# Patient Record
Sex: Female | Born: 1975 | Race: White | Hispanic: No | Marital: Married
Health system: Midwestern US, Academic
[De-identification: ages and names within clinical notes are randomized; demographics above are authoritative.]

---

## 2017-12-17 ENCOUNTER — Encounter: Admit: 2017-12-17 | Discharge: 2017-12-17 | Payer: BC Managed Care – PPO

## 2017-12-17 ENCOUNTER — Ambulatory Visit: Admit: 2017-12-17 | Discharge: 2017-12-17 | Payer: BC Managed Care – PPO

## 2017-12-17 ENCOUNTER — Encounter: Admit: 2017-12-17 | Discharge: 2017-12-17

## 2017-12-17 DIAGNOSIS — N393 Stress incontinence (female) (male): ICD-10-CM

## 2017-12-17 DIAGNOSIS — K921 Melena: ICD-10-CM

## 2017-12-17 DIAGNOSIS — Z6838 Body mass index (BMI) 38.0-38.9, adult: ICD-10-CM

## 2017-12-17 DIAGNOSIS — Z0000 Encounter for general adult medical examination without abnormal findings: ICD-10-CM

## 2017-12-17 DIAGNOSIS — N946 Dysmenorrhea, unspecified: ICD-10-CM

## 2017-12-17 DIAGNOSIS — R200 Anesthesia of skin: ICD-10-CM

## 2017-12-17 DIAGNOSIS — I10 Essential (primary) hypertension: ICD-10-CM

## 2017-12-17 DIAGNOSIS — N939 Abnormal uterine and vaginal bleeding, unspecified: Principal | ICD-10-CM

## 2017-12-17 DIAGNOSIS — N814 Uterovaginal prolapse, unspecified: ICD-10-CM

## 2017-12-17 DIAGNOSIS — N644 Mastodynia: ICD-10-CM

## 2017-12-17 DIAGNOSIS — Z889 Allergy status to unspecified drugs, medicaments and biological substances status: Principal | ICD-10-CM

## 2017-12-20 ENCOUNTER — Encounter: Admit: 2017-12-20 | Discharge: 2017-12-20

## 2017-12-21 ENCOUNTER — Encounter: Admit: 2017-12-21 | Discharge: 2017-12-21

## 2017-12-21 DIAGNOSIS — Z889 Allergy status to unspecified drugs, medicaments and biological substances status: Principal | ICD-10-CM

## 2017-12-21 DIAGNOSIS — I10 Essential (primary) hypertension: ICD-10-CM

## 2017-12-21 DIAGNOSIS — N814 Uterovaginal prolapse, unspecified: ICD-10-CM

## 2017-12-21 DIAGNOSIS — R200 Anesthesia of skin: ICD-10-CM

## 2017-12-21 DIAGNOSIS — N393 Stress incontinence (female) (male): ICD-10-CM

## 2017-12-26 ENCOUNTER — Encounter: Admit: 2017-12-26 | Discharge: 2017-12-26

## 2017-12-26 ENCOUNTER — Encounter: Admit: 2017-12-26 | Discharge: 2017-12-26 | Payer: BC Managed Care – PPO

## 2017-12-26 ENCOUNTER — Encounter: Admit: 2017-12-26 | Discharge: 2017-12-27

## 2017-12-26 DIAGNOSIS — N939 Abnormal uterine and vaginal bleeding, unspecified: ICD-10-CM

## 2017-12-26 DIAGNOSIS — N946 Dysmenorrhea, unspecified: ICD-10-CM

## 2017-12-26 DIAGNOSIS — N644 Mastodynia: Principal | ICD-10-CM

## 2017-12-26 DIAGNOSIS — N393 Stress incontinence (female) (male): ICD-10-CM

## 2017-12-26 DIAGNOSIS — Z889 Allergy status to unspecified drugs, medicaments and biological substances status: Principal | ICD-10-CM

## 2017-12-26 DIAGNOSIS — N814 Uterovaginal prolapse, unspecified: ICD-10-CM

## 2017-12-26 DIAGNOSIS — I10 Essential (primary) hypertension: ICD-10-CM

## 2017-12-26 DIAGNOSIS — R200 Anesthesia of skin: ICD-10-CM

## 2018-01-10 ENCOUNTER — Encounter: Admit: 2018-01-10 | Discharge: 2018-01-10

## 2018-01-23 ENCOUNTER — Encounter: Admit: 2018-01-23 | Discharge: 2018-01-23

## 2018-01-23 ENCOUNTER — Ambulatory Visit: Admit: 2018-01-23 | Discharge: 2018-01-23 | Payer: BC Managed Care – PPO

## 2018-01-23 DIAGNOSIS — N393 Stress incontinence (female) (male): ICD-10-CM

## 2018-01-23 DIAGNOSIS — N814 Uterovaginal prolapse, unspecified: ICD-10-CM

## 2018-01-23 DIAGNOSIS — I10 Essential (primary) hypertension: ICD-10-CM

## 2018-01-23 DIAGNOSIS — Z889 Allergy status to unspecified drugs, medicaments and biological substances status: Principal | ICD-10-CM

## 2018-01-23 DIAGNOSIS — N939 Abnormal uterine and vaginal bleeding, unspecified: Principal | ICD-10-CM

## 2018-01-23 DIAGNOSIS — Z30430 Encounter for insertion of intrauterine contraceptive device: ICD-10-CM

## 2018-01-23 DIAGNOSIS — N946 Dysmenorrhea, unspecified: ICD-10-CM

## 2018-01-23 DIAGNOSIS — R200 Anesthesia of skin: ICD-10-CM

## 2018-01-23 MED ORDER — LEVONORGESTREL 20 MCG/24 HOURS (5 YRS) 52 MG IU IUD
1 | Freq: Once | INTRAUTERINE | 0 refills | Status: CP
Start: 2018-01-23 — End: ?

## 2018-01-25 ENCOUNTER — Encounter: Admit: 2018-01-25 | Discharge: 2018-01-25

## 2018-03-06 ENCOUNTER — Encounter: Admit: 2018-03-06 | Discharge: 2018-03-06

## 2018-03-06 ENCOUNTER — Ambulatory Visit: Admit: 2018-03-06 | Discharge: 2018-03-07 | Payer: BC Managed Care – PPO

## 2018-03-06 ENCOUNTER — Ambulatory Visit: Admit: 2018-03-06 | Discharge: 2018-03-06 | Payer: BC Managed Care – PPO

## 2018-03-06 DIAGNOSIS — Z30431 Encounter for routine checking of intrauterine contraceptive device: Principal | ICD-10-CM

## 2018-03-06 DIAGNOSIS — N800 Endometriosis of uterus: ICD-10-CM

## 2018-03-06 DIAGNOSIS — R1084 Generalized abdominal pain: ICD-10-CM

## 2018-03-06 DIAGNOSIS — Z889 Allergy status to unspecified drugs, medicaments and biological substances status: Principal | ICD-10-CM

## 2018-03-06 DIAGNOSIS — N814 Uterovaginal prolapse, unspecified: ICD-10-CM

## 2018-03-06 DIAGNOSIS — R200 Anesthesia of skin: ICD-10-CM

## 2018-03-06 DIAGNOSIS — N393 Stress incontinence (female) (male): ICD-10-CM

## 2018-03-06 DIAGNOSIS — I10 Essential (primary) hypertension: ICD-10-CM

## 2018-04-17 ENCOUNTER — Encounter: Admit: 2018-04-17 | Discharge: 2018-04-17

## 2018-04-23 ENCOUNTER — Encounter: Admit: 2018-04-23 | Discharge: 2018-04-23

## 2018-04-26 ENCOUNTER — Encounter: Admit: 2018-04-26 | Discharge: 2018-04-26

## 2018-04-26 ENCOUNTER — Ambulatory Visit: Admit: 2018-04-26 | Discharge: 2018-04-27 | Payer: BC Managed Care – PPO

## 2018-04-26 DIAGNOSIS — N939 Abnormal uterine and vaginal bleeding, unspecified: Principal | ICD-10-CM

## 2018-04-26 DIAGNOSIS — R200 Anesthesia of skin: ICD-10-CM

## 2018-04-26 DIAGNOSIS — N393 Stress incontinence (female) (male): ICD-10-CM

## 2018-04-26 DIAGNOSIS — Z889 Allergy status to unspecified drugs, medicaments and biological substances status: Principal | ICD-10-CM

## 2018-04-26 DIAGNOSIS — N814 Uterovaginal prolapse, unspecified: ICD-10-CM

## 2018-04-26 DIAGNOSIS — I10 Essential (primary) hypertension: ICD-10-CM

## 2018-04-26 DIAGNOSIS — N800 Endometriosis of uterus: ICD-10-CM

## 2018-04-30 ENCOUNTER — Encounter: Admit: 2018-04-30 | Discharge: 2018-04-30

## 2018-05-01 DIAGNOSIS — N939 Abnormal uterine and vaginal bleeding, unspecified: Principal | ICD-10-CM

## 2018-05-02 ENCOUNTER — Encounter: Admit: 2018-05-02 | Discharge: 2018-05-02

## 2018-05-02 ENCOUNTER — Ambulatory Visit: Admit: 2018-05-02 | Discharge: 2018-05-02

## 2018-05-02 ENCOUNTER — Ambulatory Visit: Admit: 2018-05-02 | Discharge: 2018-05-02 | Payer: BC Managed Care – PPO

## 2018-05-02 ENCOUNTER — Ambulatory Visit: Admit: 2018-05-01 | Discharge: 2018-05-01

## 2018-05-02 DIAGNOSIS — Z889 Allergy status to unspecified drugs, medicaments and biological substances status: Principal | ICD-10-CM

## 2018-05-02 DIAGNOSIS — Z7982 Long term (current) use of aspirin: ICD-10-CM

## 2018-05-02 DIAGNOSIS — I10 Essential (primary) hypertension: ICD-10-CM

## 2018-05-02 DIAGNOSIS — N814 Uterovaginal prolapse, unspecified: ICD-10-CM

## 2018-05-02 DIAGNOSIS — R200 Anesthesia of skin: ICD-10-CM

## 2018-05-02 DIAGNOSIS — N393 Stress incontinence (female) (male): ICD-10-CM

## 2018-05-02 DIAGNOSIS — N939 Abnormal uterine and vaginal bleeding, unspecified: Principal | ICD-10-CM

## 2018-05-02 DIAGNOSIS — N946 Dysmenorrhea, unspecified: ICD-10-CM

## 2018-05-02 LAB — CBC
Lab: 11 g/dL — ABNORMAL LOW (ref 12.0–15.0)
Lab: 15 % — ABNORMAL HIGH (ref 11–15)
Lab: 23 pg — ABNORMAL LOW (ref 26–34)
Lab: 32 g/dL (ref 32.0–36.0)
Lab: 324 10*3/uL (ref 150–400)
Lab: 34 % — ABNORMAL LOW (ref 36–45)
Lab: 4.8 M/UL (ref 4.0–5.0)
Lab: 7.4 FL (ref 7–11)
Lab: 72 FL — ABNORMAL LOW (ref 80–100)
Lab: 8.3 10*3/uL (ref 4.5–11.0)

## 2018-05-02 LAB — PREGNANCY TEST-URINE
Lab: 1
Lab: NEGATIVE

## 2018-05-02 MED ORDER — LIDOCAINE (PF) 200 MG/10 ML (2 %) IJ SYRG
0 refills | Status: DC
Start: 2018-05-02 — End: 2018-05-02
  Administered 2018-05-02: 20:00:00 100 mg via INTRAVENOUS

## 2018-05-02 MED ORDER — SUGAMMADEX 100 MG/ML IV SOLN
INTRAVENOUS | 0 refills | Status: DC
Start: 2018-05-02 — End: 2018-05-02
  Administered 2018-05-02: 21:00:00 200 mg via INTRAVENOUS

## 2018-05-02 MED ORDER — LACTATED RINGERS IV SOLP
1000 mL | INTRAVENOUS | 0 refills | Status: DC
Start: 2018-05-02 — End: 2018-05-03
  Administered 2018-05-02: 21:00:00 1000.000 mL via INTRAVENOUS
  Administered 2018-05-02: 19:00:00 1000 mL via INTRAVENOUS

## 2018-05-02 MED ORDER — DEXAMETHASONE SODIUM PHOSPHATE 4 MG/ML IJ SOLN
INTRAVENOUS | 0 refills | Status: DC
Start: 2018-05-02 — End: 2018-05-02
  Administered 2018-05-02: 20:00:00 4 mg via INTRAVENOUS

## 2018-05-02 MED ORDER — OXYCODONE 5 MG PO TAB
5 mg | ORAL_TABLET | ORAL | 0 refills | 6.00000 days | Status: AC | PRN
Start: 2018-05-02 — End: ?
  Filled 2018-05-02 (×2): qty 20, 4d supply, fill #1

## 2018-05-02 MED ORDER — KETOROLAC 30 MG/ML (1 ML) IJ SOLN
0 refills | Status: DC
Start: 2018-05-02 — End: 2018-05-02
  Administered 2018-05-02: 21:00:00 30 mg via INTRAVENOUS

## 2018-05-02 MED ORDER — PROPOFOL INJ 10 MG/ML IV VIAL
0 refills | Status: DC
Start: 2018-05-02 — End: 2018-05-02
  Administered 2018-05-02: 20:00:00 200 mg via INTRAVENOUS

## 2018-05-02 MED ORDER — FENTANYL CITRATE (PF) 50 MCG/ML IJ SOLN
25 ug | INTRAVENOUS | 0 refills | Status: DC | PRN
Start: 2018-05-02 — End: 2018-05-03

## 2018-05-02 MED ORDER — LIDOCAINE (PF) 10 MG/ML (1 %) IJ SOLN
.1-2 mL | INTRAMUSCULAR | 0 refills | Status: DC | PRN
Start: 2018-05-02 — End: 2018-05-03

## 2018-05-02 MED ORDER — GABAPENTIN 300 MG PO CAP
300 mg | Freq: Once | ORAL | 0 refills | Status: CP
Start: 2018-05-02 — End: ?
  Administered 2018-05-02: 19:00:00 300 mg via ORAL

## 2018-05-02 MED ORDER — PHENYLEPHRINE IN 0.9% NACL(PF) 1 MG/10 ML (100 MCG/ML) IV SYRG
INTRAVENOUS | 0 refills | Status: DC
Start: 2018-05-02 — End: 2018-05-02
  Administered 2018-05-02 (×3): 100 ug via INTRAVENOUS

## 2018-05-02 MED ORDER — ONDANSETRON 4 MG PO TBDI
4 mg | ORAL_TABLET | ORAL | 0 refills | 8.00000 days | Status: AC | PRN
Start: 2018-05-02 — End: ?

## 2018-05-02 MED ORDER — ROCURONIUM 10 MG/ML IV SOLN
INTRAVENOUS | 0 refills | Status: DC
Start: 2018-05-02 — End: 2018-05-02
  Administered 2018-05-02: 21:00:00 10 mg via INTRAVENOUS
  Administered 2018-05-02: 20:00:00 30 mg via INTRAVENOUS
  Administered 2018-05-02: 20:00:00 10 mg via INTRAVENOUS

## 2018-05-02 MED ORDER — DEXTRAN 70-HYPROMELLOSE (PF) 0.1-0.3 % OP DPET
0 refills | Status: DC
Start: 2018-05-02 — End: 2018-05-02
  Administered 2018-05-02: 20:00:00 2 [drp] via OPHTHALMIC

## 2018-05-02 MED ORDER — CEFAZOLIN 1 GRAM IJ SOLR
0 refills | Status: DC
Start: 2018-05-02 — End: 2018-05-02
  Administered 2018-05-02: 20:00:00 3 g via INTRAVENOUS

## 2018-05-02 MED ORDER — SUCCINYLCHOLINE CHLORIDE 20 MG/ML IJ SOLN
INTRAVENOUS | 0 refills | Status: DC
Start: 2018-05-02 — End: 2018-05-02
  Administered 2018-05-02: 20:00:00 120 mg via INTRAVENOUS

## 2018-05-02 MED ORDER — ONDANSETRON HCL (PF) 4 MG/2 ML IJ SOLN
INTRAVENOUS | 0 refills | Status: DC
Start: 2018-05-02 — End: 2018-05-02
  Administered 2018-05-02: 21:00:00 4 mg via INTRAVENOUS

## 2018-05-02 MED ORDER — ONDANSETRON HCL (PF) 4 MG/2 ML IJ SOLN
4 mg | Freq: Once | INTRAVENOUS | 0 refills | Status: DC | PRN
Start: 2018-05-02 — End: 2018-05-03

## 2018-05-02 MED ORDER — FENTANYL CITRATE (PF) 50 MCG/ML IJ SOLN
0 refills | Status: DC
Start: 2018-05-02 — End: 2018-05-02
  Administered 2018-05-02: 20:00:00 100 ug via INTRAVENOUS

## 2018-05-02 MED ORDER — LIDOCAINE-EPINEPHRINE 1 %-1:100,000 IJ SOLN
0 refills | Status: DC
Start: 2018-05-02 — End: 2018-05-02
  Administered 2018-05-02: 20:00:00 10 mL via INTRAMUSCULAR

## 2018-05-02 MED ORDER — OXYCODONE 5 MG PO TAB
5-10 mg | Freq: Once | ORAL | 0 refills | Status: CP | PRN
Start: 2018-05-02 — End: ?
  Administered 2018-05-02: 22:00:00 10 mg via ORAL

## 2018-05-02 MED ORDER — MIDAZOLAM 1 MG/ML IJ SOLN
INTRAVENOUS | 0 refills | Status: DC
Start: 2018-05-02 — End: 2018-05-02
  Administered 2018-05-02: 20:00:00 2 mg via INTRAVENOUS

## 2018-05-02 MED ORDER — DIPHENHYDRAMINE HCL 50 MG/ML IJ SOLN
25 mg | Freq: Once | INTRAVENOUS | 0 refills | Status: DC | PRN
Start: 2018-05-02 — End: 2018-05-03

## 2018-05-02 MED ORDER — ACETAMINOPHEN 500 MG PO TAB
1000 mg | Freq: Once | ORAL | 0 refills | Status: CP
Start: 2018-05-02 — End: ?
  Administered 2018-05-02: 19:00:00 1000 mg via ORAL

## 2018-05-04 ENCOUNTER — Encounter: Admit: 2018-05-04 | Discharge: 2018-05-04

## 2018-05-04 DIAGNOSIS — N814 Uterovaginal prolapse, unspecified: ICD-10-CM

## 2018-05-04 DIAGNOSIS — N393 Stress incontinence (female) (male): ICD-10-CM

## 2018-05-04 DIAGNOSIS — Z889 Allergy status to unspecified drugs, medicaments and biological substances status: Principal | ICD-10-CM

## 2018-05-04 DIAGNOSIS — R200 Anesthesia of skin: ICD-10-CM

## 2018-05-04 DIAGNOSIS — I10 Essential (primary) hypertension: ICD-10-CM

## 2018-05-08 ENCOUNTER — Encounter: Admit: 2018-05-08 | Discharge: 2018-05-08

## 2019-02-14 ENCOUNTER — Encounter: Admit: 2019-02-14 | Discharge: 2019-02-14

## 2019-02-14 DIAGNOSIS — Z1231 Encounter for screening mammogram for malignant neoplasm of breast: Principal | ICD-10-CM

## 2019-05-16 ENCOUNTER — Ambulatory Visit: Admit: 2019-05-16 | Discharge: 2019-05-16

## 2019-05-16 ENCOUNTER — Encounter: Admit: 2019-05-16 | Discharge: 2019-05-16

## 2019-05-16 DIAGNOSIS — I10 Essential (primary) hypertension: Secondary | ICD-10-CM

## 2019-05-16 DIAGNOSIS — Z1231 Encounter for screening mammogram for malignant neoplasm of breast: Secondary | ICD-10-CM

## 2019-05-16 DIAGNOSIS — Z889 Allergy status to unspecified drugs, medicaments and biological substances status: Secondary | ICD-10-CM

## 2019-05-16 DIAGNOSIS — R200 Anesthesia of skin: Secondary | ICD-10-CM

## 2019-05-16 DIAGNOSIS — N393 Stress incontinence (female) (male): Secondary | ICD-10-CM

## 2019-05-16 DIAGNOSIS — N814 Uterovaginal prolapse, unspecified: Secondary | ICD-10-CM

## 2019-05-16 NOTE — Progress Notes
Name: Tiffany Abbott          MRN: [id number]      DOB: 1976/06/08      AGE: 43 y.o.   DATE OF SERVICE: 05/16/2019    Subjective:             Reason for Visit:  Wellness      SHALISSA Abbott is a 43 y.o. female.     DIAGNOSIS:    1.  Need for breast cancer screening  2.  History of left breast pain     History of Present Illness    Ms. Tiffany Abbott is a Caucasian female who presented to the [facility] on 12/26/2017 at age 23 for evaluation of left breast pain.  She reported this began approximately 6-8 months prior, and was worse during her menstrual cycle.  She described the pain as being located in the UOQ and into the left axilla.  She reported the pain was fairly constant, but varied in intensity.  It was mostly achy in nature.  She additionally reported a 6 week history of left breast itching. She had tried some topical lotions, but this had not improved her itching. She reported her bra fits her well, but it was not very supportive as she has stopped wearing underwires.  She denied any trauma. She had no prior history of breast biopsies or surgeries.  She had no other concerns such as palpable masses, skin changes, or nipple discharge.  Imaging at [place] was benign, and she was advised to have routine annual mammograms.  She presents today for that purpose.    BREAST IMAGING:  Mammogram:    -- Bilateral screening mammogram 08/03/16 [facility]) revealed no suspicious masses, microcalcifications, or suspicious architectural distortion.  Bilateral axillary lymph nodes appeared normal.  -- Bilateral diagnostic mammogram 12/26/17 ([place]) revealed no suspicious mass, architectural distortion or microcalcifications to suggest malignancy. ???No significant changes from prior exam.    REPRODUCTIVE HEALTH:  Age at first Menarche: 81   Age at First Live Birth:  78  Age at Menopause:  Pre-menopausal  Gravida:  3  Para: 3  Breastfeeding:  6-8 months with each child    PROCEDURE: None  PERTINENT PMH:  HTN FAMILY HISTORY: Patient denies a family history of breast, ovarian or prostate cancer.    PHYSICAL EXAM on PRESENTATION:  No palpable breast masses. No skin, nipple, or areolar change. No supraclavicular, infraclavicular, or axillary adenopathy.      REFERRED BY: Leone Brand, MD         Review of Systems    Constitutional: Negative for fever, chills, appetite change and fatigue.   HENT: Negative for hearing loss, congestion, rhinorrhea and tinnitus.    Eyes: Negative for pain, discharge and itching.   Respiratory: Negative for cough, chest tightness and shortness of breath.    Cardiovascular: Negative for chest pain and palpitations.   Gastrointestinal: Negative for abdominal distention, pain, nausea, vomiting, and diarrhea.   Genitourinary: Negative for frequency, vaginal bleeding, difficulty urinating and pelvic pain.   Musculoskeletal: Negative for myalgias, back pain, joint swelling and arthralgias.   Skin: Negative for rash.   Neurological: Negative for dizziness, weakness, light-headedness and headaches.   Hematological: Does not bruise/bleed easily.   Psychiatric/Behavioral: Negative for disturbed wake/sleep cycle. The patient is not nervous/anxious.    The following medical/surgical/family/social history and the list of medications are current, as of 05/16/2019    Medical History:   Diagnosis Date   ??? 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 H/O seasonal allergies    ??? Hypertension    ??? SUI (stress urinary incontinence, female) 2016    Resolved with PT   ??? Uterine prolapse 2016    Resolved with PT     Surgical History:   Procedure Laterality Date   ??? VAGINAL HYSTERECTOMY WITH REMOVAL TUBE - UTERUS 250 G LESS Bilateral 05/02/2018    Performed by Tawny Hopping, MD at [facility] OR   ??? CYSTOURETHROSCOPY N/A 05/02/2018    Performed by Tawny Hopping, MD at [facility] OR   ??? HX CHOLECYSTECTOMY     ??? WISDOM TEETH EXTRACTION       Family History   Problem Relation Age of Onset ??? Hypertension Father    ??? Hypertension Mother    ??? Cancer Paternal Grandmother    ??? Cancer-Uterine Paternal Grandmother    ??? Heart Failure Maternal Grandmother    ??? Parkinson's  Maternal Grandfather    ??? Other Son         MARFANS - father side of family     Social History     Socioeconomic History   ??? Marital status: Married     Spouse name: Not on file   ??? Number of children: 3   ??? Years of education: Not on file   ??? Highest education level: Not on file   Occupational History   ??? Occupation: Runner, broadcasting/film/video   Tobacco Use   ??? Smoking status: Never Smoker   ??? Smokeless tobacco: Never Used   Substance and Sexual Activity   ??? Alcohol use: No     Alcohol/week: 0.0 standard drinks   ??? Drug use: No   ??? Sexual activity: Yes     Partners: Male   Other Topics Concern   ??? Not on file   Social History Narrative   ??? Not on file       Allergies   Allergen Reactions   ??? Seasonal Allergies UNKNOWN       Objective:         ??? ALPRAZolam (XANAX) 0.25 mg tablet    ??? aspirin EC 81 mg tablet Take 81 mg by mouth daily. Take with food.   ??? cetirizine HCl (ZYRTEC PO) Take  by mouth.   ??? ondansetron (ZOFRAN ODT) 4 mg rapid dissolve tablet Dissolve one tablet by mouth every 8 hours as needed. Place on tongue to disolve.   ??? oxyCODONE (ROXICODONE, OXY-IR) 5 mg tablet Take one tablet by mouth every 4 hours as needed for Pain     Vitals:    05/16/19 1333   BP: (!) 150/86   BP Source: Arm, Left Upper   Patient Position: Sitting   Pulse: 83   Resp: 20   Temp: 36.8 ???C (98.2 ???F)   TempSrc: Temporal   SpO2: 100%   Weight: 121.1 kg (267 lb)   Height: 177.8 cm (70)   PainSc: Three     Body mass index is 38.31 kg/m???.     Pain Score: Three  Pain Loc: Shoulder         Pain Addressed:  N/A    Patient Evaluated for a Clinical Trial: No treatment clinical trial available for this patient.     [place] Cooperative Oncology Group performance status is 0, Fully active, able to carry on all pre-disease performance without restriction.Marland Kitchen     Physical Exam Vitals signs reviewed.  RIGHT BREAST EXAM:  Breast:  No palpable breast masses. No skin, nipple, or areolar change.   Skin Erythema:  No  Attachment of Overlying Skin:  No  Peau d' orange:  No  Chest Wall Attachment:  No  Nipple Inversion:  No  Nipple Discharge: No    LEFT BREAST EXAM:  Breast: No palpable breast masses. No skin, nipple, or areolar change.   Skin Erythema:  No  Attachment of Overlying Skin:  No  Peau d' orange:  No  Chest Wall Attachment: No  Nipple Inversion:  No  Nipple Discharge:  No    RIGHT NODAL BASIN EXAM:  Axillary:  negative  Infraclavicular:  negative  Supraclavicular:  negative    LEFT NODAL BASIN EXAM:  Axillary:  negative  Infraclavicular: negative  Supraclavicular:  negative    Constitutional: No acute distress.  HEENT:  Head: Normocephalic and atraumatic.  Eyes: No discharge. No scleral icterus.  Pulmonary/Chest: No respiratory distress.   Lymphadenopathy: There is no axillary, infraclavicular, or supraclavicular adenopathy.  Neurological: Alert and oriented to person, place and time. No cranial nerve deficit.  Skin: Warm and dry. No rash noted. No erythema. No pallor.  Psychiatric: Normal mood and affect. Behavior is normal. Judgement and thought content normal.    Imaging Review:    [id number] DIGITAL MAMMO SCREEN BILAT/TOMO/CAD: May 16, 2019 -   ACCESSION #: [id number]  3D Procedure  3D Routine views.  2D Synthetic Routine views.    Prior study comparison: December 26, 2017, bilateral [id number] MAMMO   DIAGNOSTIC BIL/TOMO, performed at [facility]. ???August 03, 2016, mammogram.    There are scattered areas of fibroglandular density. ???3-D and   synthetic 2-D images of the bilateral breasts were obtained.    There are no new suspicious masses, calcifications, or areas of   architectural distortion. There is no significant change when   compared with prior mammograms.    By my electronic signature, I attest that I have personally reviewed the images for this examination and formulated the   interpretations and opinions expressed in this report      Finalized by Nicholos Johns, M.D. on 05/16/2019 1:38 PM. Dictated  by Denny Peon, M.D. on 05/16/2019 1:34 PM.      Electronically signed and approved by: Nicholos Johns, M.D.   [id number]  IMPRESSION    ASSESSMENT: BIRAD 1-Negative        RECOMMENDATION:  Routine screening mammogram of both breasts in 1 year.         Assessment and Plan:    43 y/o female presenting for asymptomatic breast exam and bilateral screening mammogram     Ms. Fraise is doing well.  Her exam and mammogram today are both benign.  She had a vaginal hysterectomy in 04/2018 with Dr. Juanita Craver due to DUB, and has recovered well.  There have been no other changes to her past medical or family history since her last visit.  She will RTC in 1 year for CBE at the time of her next bilateral screening mammogram.  She will call in the interim with any questions or concerns. Ms. Carothers was given ample time to ask questions all of which were answered to her satisfaction.     1.  Bilateral screening mammogram in 1 year  2.  RTC in 1 year    Scarlett Presto, [place]

## 2021-05-13 ENCOUNTER — Encounter: Admit: 2021-05-13 | Discharge: 2021-05-13 | Payer: BC Managed Care – PPO

## 2021-05-13 DIAGNOSIS — Z1231 Encounter for screening mammogram for malignant neoplasm of breast: Secondary | ICD-10-CM

## 2021-05-17 ENCOUNTER — Encounter: Admit: 2021-05-17 | Discharge: 2021-05-17 | Payer: BC Managed Care – PPO

## 2021-05-17 ENCOUNTER — Ambulatory Visit: Admit: 2021-05-17 | Discharge: 2021-05-17 | Payer: BC Managed Care – PPO

## 2021-05-17 DIAGNOSIS — Z1231 Encounter for screening mammogram for malignant neoplasm of breast: Secondary | ICD-10-CM

## 2021-08-25 ENCOUNTER — Encounter: Admit: 2021-08-25 | Discharge: 2021-08-25 | Payer: BC Managed Care – PPO

## 2022-09-29 IMAGING — MG MAMMO SCREEN BILAT/TOMO
5 series · 5 of 5 positions shown · non-contrast
Comparison: none

[R CC synth-2D (1 of 2)]
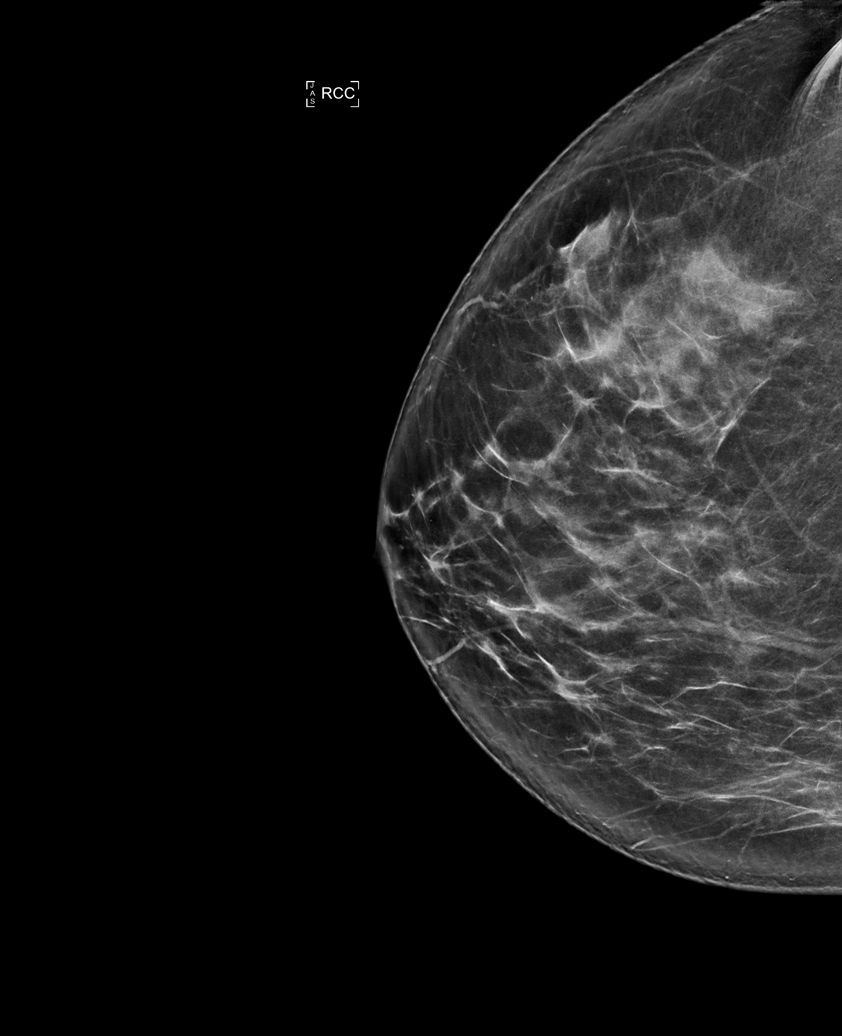

[R CC synth-2D (2 of 2)]
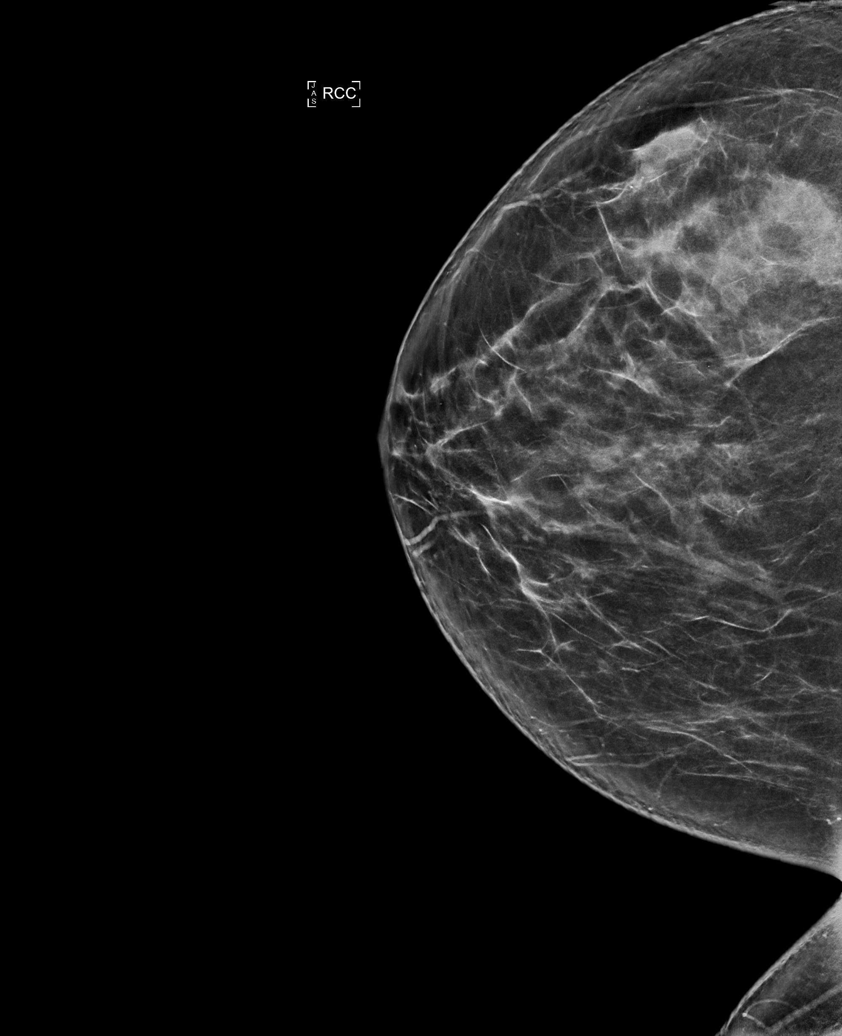

[R MLO synth-2D]
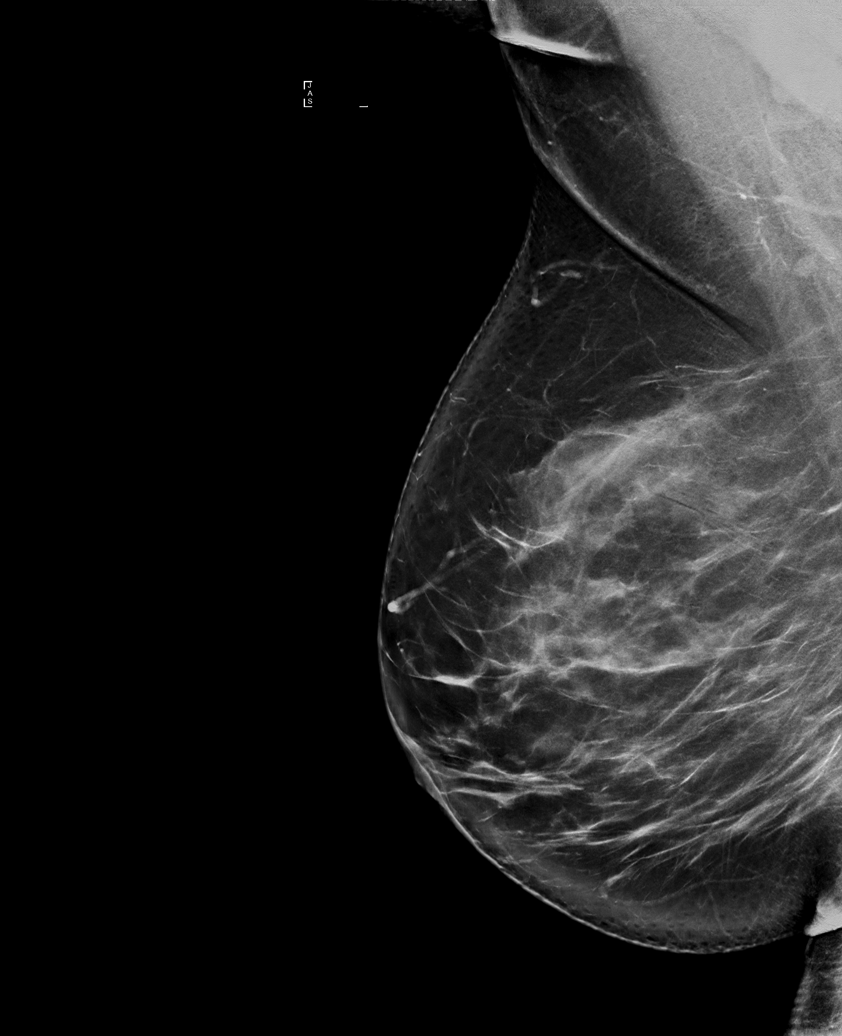

[L CC synth-2D]
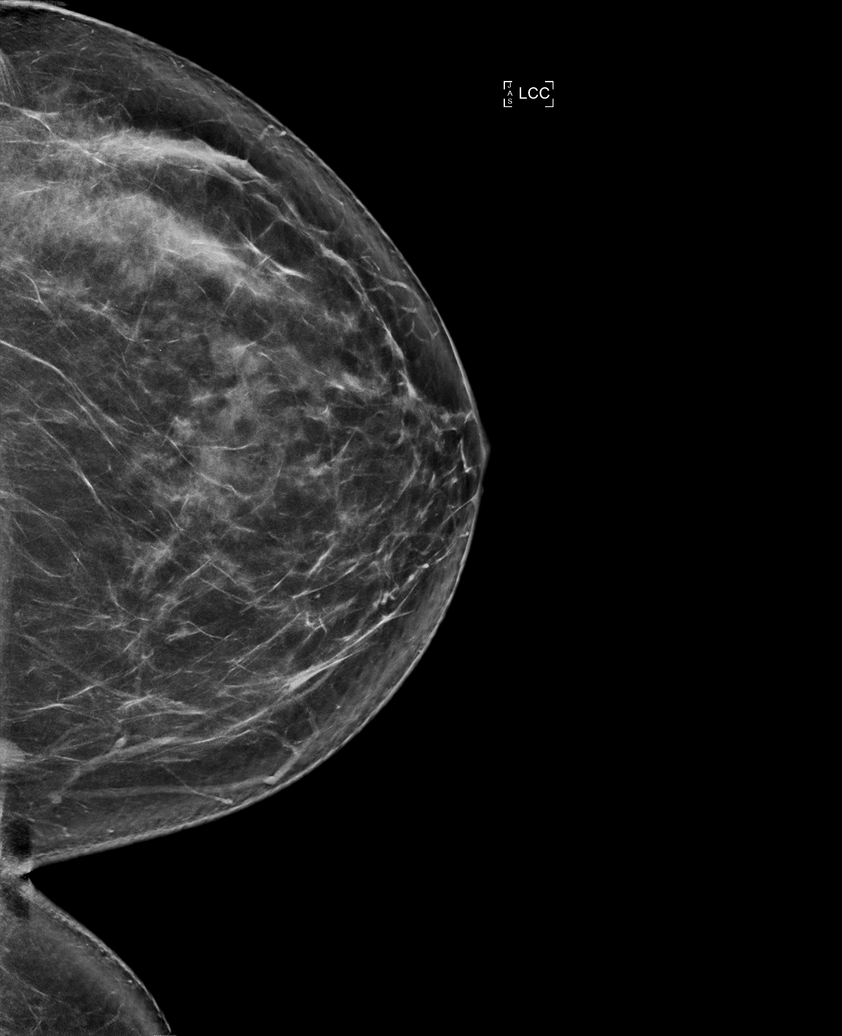

[L MLO synth-2D]
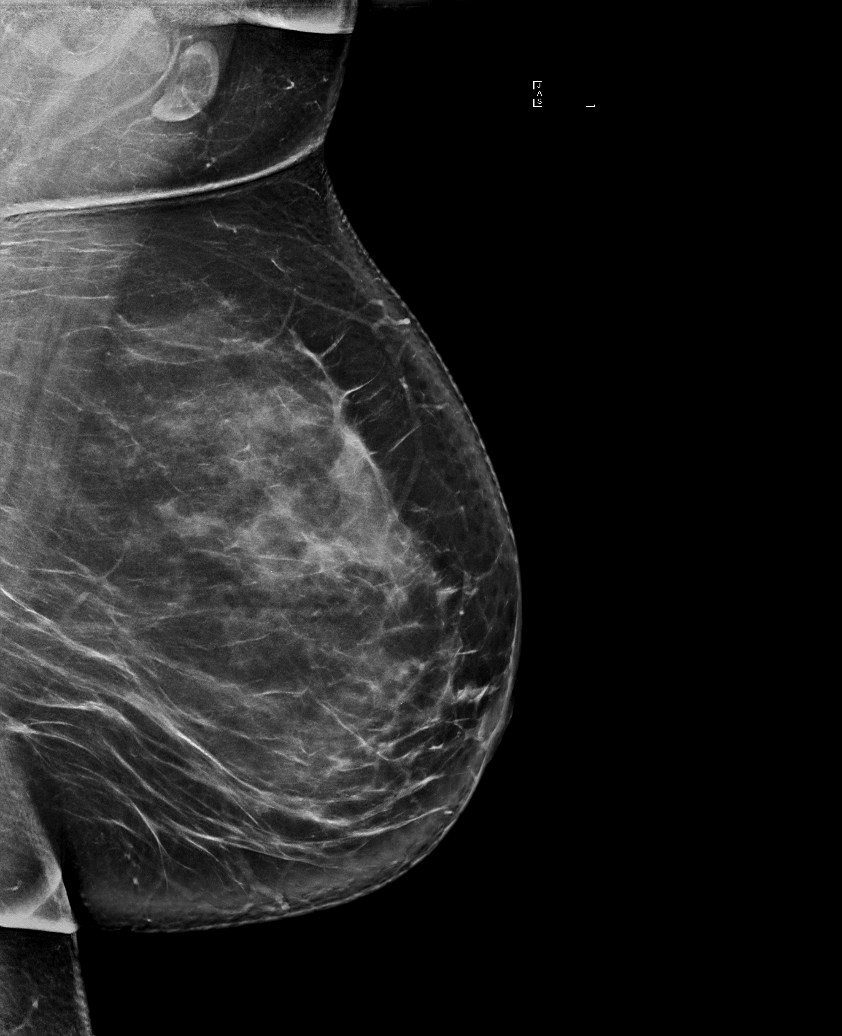

[5 of 5 positions shown; findings below may reference images not displayed]

Last mammogram was performed 2 years ago. 
[id number] DIGITAL MAMMO SCREEN BILAT/TOMO/CAD: May 17, 2021 - 
ACCESSION #: [id number] 
3D Procedure
3D Routine views. 
2D Synthetic Routine views. 
Technologist: Ceola, Felner 
Prior study comparison: May 16, 2019, bilateral [id number] DIGITAL  
MAMMO SCREEN BILAT/TOMO/CAD, performed at [REDACTED] 
[REDACTED].  December 26, 2017, bilateral [id number] MAMMO  
DIAGNOSTIC BIL/TOMO, performed at [REDACTED] [REDACTED].  August 03, 2016, mammogram. 
The breasts are heterogeneously dense, which may obscure small  
masses. The breast tissue is heterogeneously dense, which is a  
risk factor for breast cancer and lowers the sensitivity of 
detection of breast cancer. This patient may benefit from 
supplementary screening with ultrasound. This procedure will  
require a physician order. Scheduling phone # is [phone].  
Similar information has been provided in the lay letter to this 
patient. (Please disregard if screening ultrasound or MRI has 
already been done.)  3-D (digital tomosynthesis) and synthetic  
2-D images were obtained bilaterally. 
No suspicious abnormality is seen.
By my electronic signature, I attest that I have personally 
reviewed the images for this examination and formulated the 
interpretations and opinions expressed in this report 
Finalized by W[place]ito Gadari, M.D. on 05/17/2021 [DATE].  
[id number]
IMPRESSION
ASSESSMENT: BIRAD 1-Negative
RECOMMENDATION: 
Routine screening mammogram in 1 year.
